# Patient Record
Sex: Female | Born: 1995 | Race: Black or African American | Hispanic: No | Marital: Single | Smoking: Never smoker
Health system: Southern US, Community
[De-identification: ages and names within clinical notes are randomized; demographics above are authoritative.]

## PROBLEM LIST (undated history)

## (undated) DIAGNOSIS — R7303 Prediabetes: Secondary | ICD-10-CM

## (undated) HISTORY — DX: Prediabetes: R73.03

---
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
## 2017-04-16 ENCOUNTER — Ambulatory Visit: Payer: Medicaid Other | Attending: Family Medicine | Admitting: Family Medicine

## 2017-04-16 ENCOUNTER — Encounter: Payer: Self-pay | Admitting: Family Medicine

## 2017-04-16 VITALS — BP 132/82 | HR 117 | Temp 98.4°F | Wt 352.0 lb

## 2017-04-16 DIAGNOSIS — Z0189 Encounter for other specified special examinations: Secondary | ICD-10-CM | POA: Diagnosis present

## 2017-04-16 DIAGNOSIS — Z7984 Long term (current) use of oral hypoglycemic drugs: Secondary | ICD-10-CM | POA: Diagnosis not present

## 2017-04-16 DIAGNOSIS — R7303 Prediabetes: Secondary | ICD-10-CM

## 2017-04-16 NOTE — Patient Instructions (Signed)

## 2017-04-16 NOTE — Progress Notes (Signed)
Subjective:  Patient ID: Amy Shea, female    DOB: [birth year]  Age: 22 y.o. MRN: [id number]  CC: Establish Care and Weight Loss   HPI Amy Shea is a 22 year old obese female with a history of Pre Diabetes who presents to establish care with me. "I don't know why I'm here, my mom made the appointment". She is interested in weight loss and has attended the seminars required at the Bariatric clinic in anticipation of lapband procedure but is yet to meet with the surgeon. She would like to see what other options are available besides surgery.  She weighs 352 lbs and does not has a family history of obesity as he siblings are size 4 and 6. States she has always been a plus size since she was 14. She goes to the gym 3-4 times a week and eats salads most of the time cutting out crabs and sweets with no much results. The most she has lost is 30lbs which she gained back.  Past Medical History:  Diagnosis Date  . Pre-diabetes 06/04/2013    No past surgical history on file.  No Known Allergies    Outpatient Medications Prior to Visit  Medication Sig Dispense Refill  . acetaminophen (TYLENOL) 325 MG tablet Take 650 mg by mouth every 6 (six) hours as needed for pain (headache).    . ferrous sulfate 325 (65 FE) MG tablet Take 1 tablet (325 mg total) by mouth 2 (two) times daily with a meal. (Patient not taking: Reported on 11/21/2015) 60 tablet 3  . metFORMIN (GLUCOPHAGE) 500 MG tablet TAKE 1 TABLET BY MOUTH TWICE DAILY WITH A MEAL (Patient not taking: Reported on 11/21/2015) 60 tablet 0  . ondansetron (ZOFRAN ODT) 4 MG disintegrating tablet Take 1 tablet (4 mg total) by mouth every 8 (eight) hours as needed for nausea. (Patient not taking: Reported on 11/21/2015) 20 tablet 1  . ranitidine (ZANTAC) 150 MG tablet Take 1 tablet (150 mg total) by mouth 2 (two) times daily. (Patient not taking: Reported on 11/21/2015) 60 tablet 6  . VESTURA 3-0.02 MG tablet TAKE 1 TABLET BY MOUTH  ONCE DAILY (Patient not taking: Reported on 11/21/2015) 28 tablet 0   No facility-administered medications prior to visit.     ROS Review of Systems  Constitutional: Negative for activity change, appetite change and fatigue.  HENT: Negative for congestion, sinus pressure and sore throat.   Eyes: Negative for visual disturbance.  Respiratory: Negative for cough, chest tightness, shortness of breath and wheezing.   Cardiovascular: Negative for chest pain and palpitations.  Gastrointestinal: Negative for abdominal distention, abdominal pain and constipation.  Endocrine: Negative for polydipsia.  Genitourinary: Negative for dysuria and frequency.  Musculoskeletal: Negative for arthralgias and back pain.  Skin: Negative for rash.  Neurological: Negative for tremors, light-headedness and numbness.  Hematological: Does not bruise/bleed easily.  Psychiatric/Behavioral: Negative for agitation and behavioral problems.    Objective:  BP 132/82   Pulse (!) 117   Temp 98.4 F (36.9 C) (Oral)   Wt (!) 352 lb (159.7 kg)   SpO2 100%   BMI 55.96 kg/m   BP/Weight 04/16/2017 7/82/9562 03/07/863  Systolic BP 784 696 295  Diastolic BP 82 86 76  Wt. (Lbs) 352 323.4 281  BMI 55.96 51.42 44.36      Physical Exam  Constitutional: She is oriented to person, place, and time. She appears well-developed and well-nourished.  Cardiovascular: Normal heart sounds and intact distal pulses. Tachycardia present.  No murmur heard. Pulmonary/Chest: Effort normal and breath sounds normal. She has no wheezes. She has no rales. She exhibits no tenderness.  Abdominal: Soft. Bowel sounds are normal. She exhibits no distension and no mass. There is no tenderness.  Musculoskeletal: Normal range of motion.  Neurological: She is alert and oriented to person, place, and time.     Assessment & Plan:   1. Pre-diabetes Last A1c was 5.5 If A1c returns in the pre diabetic range I will commence Saxenda or Victoza  depending on insurance coverage to also help with weight loss. Advised on continuing with a diabetic diet to prevent development of Diabetes mellitus - CMP14+EGFR - Hemoglobin A1c  2. Morbid obesity ([place]) Commended on current efforts Discussed daily caloric goal of 1200cal, weight loss of 1-2 lbs/week recommended which is about 3500-7000 calorie cut per week Exercise 150 min/week Discussed long term medications for management of obesity, side effect profile and insurance coverage Surgery will produce the best result and she has been advised that lifestyle modifications will produce the greatest long term benefit as that is an adjunct to medications and surgery. - CBC with Differential/Platelet - TSH   No orders of the defined types were placed in this encounter.   Follow-up: Return in about 1 month (around 05/14/2017) for follow up on weight problems.   Charlott Rakes MD

## 2017-04-17 ENCOUNTER — Other Ambulatory Visit: Payer: Self-pay | Admitting: Family Medicine

## 2017-04-17 ENCOUNTER — Telehealth: Payer: Self-pay

## 2017-04-17 ENCOUNTER — Encounter: Payer: Self-pay | Admitting: Family Medicine

## 2017-04-17 LAB — CMP14+EGFR
ALBUMIN: 4.1 g/dL (ref 3.5–5.5)
ALT: 46 IU/L — ABNORMAL HIGH (ref 0–32)
AST: 21 IU/L (ref 0–40)
Albumin/Globulin Ratio: 1.3 (ref 1.2–2.2)
Alkaline Phosphatase: 85 IU/L (ref 39–117)
BUN / CREAT RATIO: 10 (ref 9–23)
BUN: 7 mg/dL (ref 6–20)
CO2: 24 mmol/L (ref 20–29)
CREATININE: 0.69 mg/dL (ref 0.57–1.00)
Calcium: 9.5 mg/dL (ref 8.7–10.2)
Chloride: 104 mmol/L (ref 96–106)
GFR calc non Af Amer: 126 mL/min/{1.73_m2} (ref >59)
GFR, EST AFRICAN AMERICAN: 145 mL/min/{1.73_m2} (ref >59)
GLUCOSE: 84 mg/dL (ref 65–99)
Globulin, Total: 3.2 g/dL (ref 1.5–4.5)
Potassium: 4.1 mmol/L (ref 3.5–5.2)
Sodium: 141 mmol/L (ref 134–144)
TOTAL PROTEIN: 7.3 g/dL (ref 6.0–8.5)

## 2017-04-17 LAB — CBC WITH DIFFERENTIAL/PLATELET
Basophils Absolute: 0.1 10*3/uL (ref 0.0–0.2)
Basos: 1 %
EOS (ABSOLUTE): 0.3 10*3/uL (ref 0.0–0.4)
Eos: 4 %
HEMOGLOBIN: 10.9 g/dL — AB (ref 11.1–15.9)
Hematocrit: 34 % (ref 34.0–46.6)
Immature Grans (Abs): 0 10*3/uL (ref 0.0–0.1)
Immature Granulocytes: 0 %
LYMPHS ABS: 1.7 10*3/uL (ref 0.7–3.1)
Lymphs: 24 %
MCH: 22.6 pg — ABNORMAL LOW (ref 26.6–33.0)
MCHC: 32.1 g/dL (ref 31.5–35.7)
MCV: 70 fL — AB (ref 79–97)
MONOCYTES: 13 %
Monocytes Absolute: 0.9 10*3/uL (ref 0.1–0.9)
NEUTROS ABS: 4.2 10*3/uL (ref 1.4–7.0)
Neutrophils: 58 %
Platelets: 458 10*3/uL — ABNORMAL HIGH (ref 150–379)
RBC: 4.83 x10E6/uL (ref 3.77–5.28)
RDW: 18 % — ABNORMAL HIGH (ref 12.3–15.4)
WBC: 7.1 10*3/uL (ref 3.4–10.8)

## 2017-04-17 LAB — HEMOGLOBIN A1C
Est. average glucose Bld gHb Est-mCnc: 131 mg/dL
HEMOGLOBIN A1C: 6.2 % — AB (ref 4.8–5.6)

## 2017-04-17 LAB — TSH: TSH: 2.18 u[IU]/mL (ref 0.450–4.500)

## 2017-04-17 MED ORDER — INSULIN PEN NEEDLE 31G X 5 MM MISC: 1.0000 | Freq: Every day | 5 refills | Status: AC

## 2017-04-17 MED ORDER — FERROUS SULFATE 325 (65 FE) MG PO TABS: 325.0000 mg | ORAL_TABLET | Freq: Two times a day (BID) | ORAL | 3 refills | Status: DC

## 2017-04-17 MED ORDER — LIRAGLUTIDE -WEIGHT MANAGEMENT 18 MG/3ML ~~LOC~~ SOPN: 0.6000 mg | PEN_INJECTOR | Freq: Every day | SUBCUTANEOUS | 3 refills | Status: DC

## 2017-04-17 NOTE — Telephone Encounter (Signed)
Patient was called and informed of lab results and medication being sent to pharmacy. 

## 2017-04-22 ENCOUNTER — Telehealth: Payer: Self-pay | Admitting: Family Medicine

## 2017-04-22 NOTE — Telephone Encounter (Signed)
Patient called and requested for an alternate of listed medication due to insurance not covering it. Please fu at your earliest convenience.  Liraglutide -Weight Management (SAXENDA) 18 MG/3ML SOPN [[id number]

## 2017-04-24 ENCOUNTER — Encounter: Payer: Self-pay | Admitting: Pharmacist

## 2017-04-24 MED ORDER — LIRAGLUTIDE 18 MG/3ML ~~LOC~~ SOPN: 0.6000 mg | PEN_INJECTOR | Freq: Every day | SUBCUTANEOUS | 3 refills | Status: AC

## 2017-04-24 NOTE — Progress Notes (Signed)
PA submitted for Victoza. Pending approval by [place] Medicaid 

## 2017-04-24 NOTE — Telephone Encounter (Signed)
A new prescription for Victoza has been sent to her pharmacy.

## 2017-04-25 NOTE — Progress Notes (Signed)
Victoza approved.

## 2017-05-09 ENCOUNTER — Other Ambulatory Visit: Payer: Self-pay | Admitting: Surgical Oncology

## 2017-05-09 DIAGNOSIS — K449 Diaphragmatic hernia without obstruction or gangrene: Secondary | ICD-10-CM

## 2017-05-14 ENCOUNTER — Ambulatory Visit: Payer: Medicaid Other | Admitting: Family Medicine

## 2017-05-15 ENCOUNTER — Ambulatory Visit
Admission: RE | Admit: 2017-05-15 | Discharge: 2017-05-15 | Disposition: A | Discharge status: Home / Self Care | Payer: Medicaid Other | Source: Ambulatory Visit | Attending: Surgical Oncology | Admitting: Surgical Oncology

## 2017-05-15 DIAGNOSIS — K449 Diaphragmatic hernia without obstruction or gangrene: Secondary | ICD-10-CM

## 2017-10-29 ENCOUNTER — Ambulatory Visit (INDEPENDENT_AMBULATORY_CARE_PROVIDER_SITE_OTHER): Payer: Medicaid Other | Admitting: Psychiatry

## 2017-10-29 DIAGNOSIS — F4320 Adjustment disorder, unspecified: Secondary | ICD-10-CM

## 2018-06-04 ENCOUNTER — Ambulatory Visit (HOSPITAL_COMMUNITY)
Admission: EM | Admit: 2018-06-04 | Discharge: 2018-06-04 | Disposition: A | Discharge status: Home / Self Care | Payer: Self-pay | Attending: Family Medicine | Admitting: Family Medicine

## 2018-06-04 ENCOUNTER — Other Ambulatory Visit: Payer: Self-pay

## 2018-06-04 ENCOUNTER — Encounter (HOSPITAL_COMMUNITY): Payer: Self-pay

## 2018-06-04 DIAGNOSIS — R109 Unspecified abdominal pain: Secondary | ICD-10-CM

## 2018-06-04 NOTE — ED Triage Notes (Signed)
Pt states she has right flank pain, its worst when she eats. X 3 days.

## 2018-06-04 NOTE — Discharge Instructions (Signed)
You have been seen today for right flank pain. Your evaluation was not suggestive of any emergent condition requiring medical intervention at this time. However, some abdominal problems make take more time to appear. Therefore, it's very important for you to pay attention to any new symptoms or worsening of your current condition.  Please return here or to the Emergency Department immediately should you feel worse in any way or have any of the following symptoms: increasing or different abdominal pain, persistent vomiting, fevers, or shaking chills.

## 2018-06-04 NOTE — ED Notes (Signed)
Patient verbalizes understanding of discharge instructions. Opportunity for questioning and answers were provided. Patient discharged from [facility] by MD. 

## 2018-06-12 ENCOUNTER — Other Ambulatory Visit: Payer: Self-pay | Admitting: Nurse Practitioner

## 2018-06-12 DIAGNOSIS — R1011 Right upper quadrant pain: Secondary | ICD-10-CM

## 2018-06-17 ENCOUNTER — Other Ambulatory Visit: Payer: Self-pay

## 2018-06-17 NOTE — ED Provider Notes (Signed)
[facility]   [id number] 06/04/18 Arrival Time: 1109  ASSESSMENT & PLAN:  1. Right flank pain    Benign abdominal exam. No indications for urgent abdominal/pelvic imaging at this time. Discussed. She is unable to provide urine sample. Unclear of exact etiology at this time. Discussed possibility of constipation causing her symptoms. Cannot r/o nephrolithiasis. Will watch her symptoms closely.   Discharge Instructions     You have been seen today for right flank pain. Your evaluation was not suggestive of any emergent condition requiring medical intervention at this time. However, some abdominal problems make take more time to appear. Therefore, it's very important for you to pay attention to any new symptoms or worsening of your current condition.  Please return here or to the Emergency Department immediately should you feel worse in any way or have any of the following symptoms: increasing or different abdominal pain, persistent vomiting, fevers, or shaking chills.     Follow-up Information    [facility]   Specialty:  Urgent Care Contact information: [address] [name] [phone]         Reviewed expectations re: course of current medical issues. Questions answered. Outlined signs and symptoms indicating need for more acute intervention. Patient verbalized understanding. After Visit Summary given.   SUBJECTIVE: History from: patient. Amy Shea is a 23 y.o. female who presents with complaint of intermittent R flank discomfort. Onset abrupt, about 3 days ago. Discomfort described as sharp; without radiation. Symptoms are gradually improving since beginning. Fever: absent. Aggravating factors: does report noticing pain sometimes after she eats but not always. Alleviating factors: have not been identified. Associated symptoms: none reported. She denies arthralgias, belching, chills,  constipation, diarrhea, dysuria, headache, hematuria, myalgias, nausea, sweats and vomiting. Appetite: normal. PO intake: normal. Ambulatory without assistance. Urinary symptoms: none. Bowel movements: questions decrease in bowel movement frequency; last bowel movement within the past 3 days and without blood. Is passing gas from rectum. History of similar: no. OTC treatment: none reported.  Patient's last menstrual period was 05/19/2018.   History reviewed. No pertinent surgical history.  ROS: As per HPI. All other systems negative.  OBJECTIVE:  Vitals:   06/04/18 1133 06/04/18 1134  BP: (!) 147/105   Pulse: 99   Resp: 18   Temp: 98 F (36.7 C)   TempSrc: Oral   SpO2: 99%   Weight:  90.7 kg    General appearance: alert, oriented, no acute distress  HEENT: oropharynx moist Lungs: clear to auscultation bilaterally; unlabored respirations Heart: regular rate and rhythm Abdomen: soft; without distention; no tenderness; normal bowel sounds; without masses or organomegaly; without guarding or rebound tenderness Back: without CVA tenderness; FROM at waist Extremities: without LE edema; symmetrical; without gross deformities Skin: warm and dry Neurologic: normal gait Psychological: alert and cooperative; normal mood and affect  No Known Allergies                                             Past Medical History:  Diagnosis Date  . Pre-diabetes 06/04/2013   Social History   Socioeconomic History  . Marital status: Single    Spouse name: Not on file  . Number of children: Not on file  . Years of education: Not on file  . Highest education level: Not on file  Occupational History  .  Not on file  Social Needs  . Financial resource strain: Not on file  . Food insecurity:    Worry: Not on file    Inability: Not on file  . Transportation needs:    Medical: Not on file    Non-medical: Not on file  Tobacco Use  . Smoking status: Never Smoker  . Smokeless tobacco: Never Used   Substance and Sexual Activity  . Alcohol use: No  . Drug use: No  . Sexual activity: Not on file  Lifestyle  . Physical activity:    Days per week: Not on file    Minutes per session: Not on file  . Stress: Not on file  Relationships  . Social connections:    Talks on phone: Not on file    Gets together: Not on file    Attends religious service: Not on file    Active member of club or organization: Not on file    Attends meetings of clubs or organizations: Not on file    Relationship status: Not on file  . Intimate partner violence:    Fear of current or ex partner: Not on file    Emotionally abused: Not on file    Physically abused: Not on file    Forced sexual activity: Not on file  Other Topics Concern  . Not on file  Social History Narrative   [facility] [employer]    Lives with mom and 2 brothers   Family History  Problem Relation Age of Onset  . Diabetes Mother   . Hypertension Mother      Mardella LaymanHagler, Rees Santistevan, MD 06/17/18 [phone]

## 2018-06-25 ENCOUNTER — Emergency Department (HOSPITAL_COMMUNITY): Payer: Self-pay

## 2018-06-25 ENCOUNTER — Emergency Department (HOSPITAL_COMMUNITY)
Admission: EM | Admit: 2018-06-25 | Discharge: 2018-06-25 | Disposition: A | Discharge status: Home / Self Care | Payer: Self-pay | Attending: Emergency Medicine | Admitting: Emergency Medicine

## 2018-06-25 ENCOUNTER — Other Ambulatory Visit: Payer: Self-pay

## 2018-06-25 ENCOUNTER — Encounter (HOSPITAL_COMMUNITY): Payer: Self-pay

## 2018-06-25 DIAGNOSIS — I10 Essential (primary) hypertension: Secondary | ICD-10-CM | POA: Insufficient documentation

## 2018-06-25 DIAGNOSIS — Z79899 Other long term (current) drug therapy: Secondary | ICD-10-CM | POA: Insufficient documentation

## 2018-06-25 DIAGNOSIS — D649 Anemia, unspecified: Secondary | ICD-10-CM | POA: Insufficient documentation

## 2018-06-25 DIAGNOSIS — N200 Calculus of kidney: Secondary | ICD-10-CM | POA: Insufficient documentation

## 2018-06-25 DIAGNOSIS — N201 Calculus of ureter: Secondary | ICD-10-CM | POA: Insufficient documentation

## 2018-06-25 DIAGNOSIS — R109 Unspecified abdominal pain: Secondary | ICD-10-CM

## 2018-06-25 LAB — CBC
HCT: 29 % — ABNORMAL LOW (ref 36.0–46.0)
Hemoglobin: 8.6 g/dL — ABNORMAL LOW (ref 12.0–15.0)
MCH: 21.8 pg — ABNORMAL LOW (ref 26.0–34.0)
MCHC: 29.7 g/dL — ABNORMAL LOW (ref 30.0–36.0)
MCV: 73.6 fL — ABNORMAL LOW (ref 80.0–100.0)
Platelets: 488 10*3/uL — ABNORMAL HIGH (ref 150–400)
RBC: 3.94 MIL/uL (ref 3.87–5.11)
RDW: 18.6 % — ABNORMAL HIGH (ref 11.5–15.5)
WBC: 8.8 10*3/uL (ref 4.0–10.5)
nRBC: 0 % (ref 0.0–0.2)

## 2018-06-25 LAB — BASIC METABOLIC PANEL
Anion gap: 8 (ref 5–15)
BUN: 11 mg/dL (ref 6–20)
CO2: 24 mmol/L (ref 22–32)
Calcium: 8.8 mg/dL — ABNORMAL LOW (ref 8.9–10.3)
Chloride: 106 mmol/L (ref 98–111)
Creatinine, Ser: 0.89 mg/dL (ref 0.44–1.00)
GFR calc Af Amer: >60 mL/min (ref >60)
GFR calc non Af Amer: >60 mL/min (ref >60)
Glucose, Bld: 101 mg/dL — ABNORMAL HIGH (ref 70–99)
Potassium: 4 mmol/L (ref 3.5–5.1)
Sodium: 138 mmol/L (ref 135–145)

## 2018-06-25 LAB — FOLATE: Folate: 16.6 ng/mL (ref >5.9)

## 2018-06-25 LAB — URINALYSIS, ROUTINE W REFLEX MICROSCOPIC
Bilirubin Urine: NEGATIVE
Glucose, UA: NEGATIVE mg/dL
Ketones, ur: NEGATIVE mg/dL
Nitrite: NEGATIVE
Protein, ur: NEGATIVE mg/dL
RBC / HPF: >50 RBC/hpf — ABNORMAL HIGH (ref 0–5)
Specific Gravity, Urine: 1.012 (ref 1.005–1.030)
pH: 7 (ref 5.0–8.0)

## 2018-06-25 LAB — I-STAT BETA HCG BLOOD, ED (MC, WL, AP ONLY): I-stat hCG, quantitative: <5 m[IU]/mL (ref <5)

## 2018-06-25 LAB — RETICULOCYTES
Immature Retic Fract: 10.8 % (ref 2.3–15.9)
RBC.: 4.23 MIL/uL (ref 3.87–5.11)
Retic Count, Absolute: 34.3 10*3/uL (ref 19.0–186.0)
Retic Ct Pct: 0.8 % (ref 0.4–3.1)

## 2018-06-25 LAB — VITAMIN B12: Vitamin B-12: 511 pg/mL (ref 180–914)

## 2018-06-25 LAB — IRON AND TIBC
Iron: 13 ug/dL — ABNORMAL LOW (ref 28–170)
Saturation Ratios: 3 % — ABNORMAL LOW (ref 10.4–31.8)
TIBC: 448 ug/dL (ref 250–450)
UIBC: 435 ug/dL

## 2018-06-25 LAB — FERRITIN: Ferritin: 7 ng/mL — ABNORMAL LOW (ref 11–307)

## 2018-06-25 MED ORDER — TAMSULOSIN HCL 0.4 MG PO CAPS: 0.4000 mg | ORAL_CAPSULE | Freq: Every day | ORAL | 0 refills | Status: DC

## 2018-06-25 MED ORDER — HYDROCODONE-ACETAMINOPHEN 5-325 MG PO TABS
1.0000 | ORAL_TABLET | Freq: Once | ORAL | Status: AC
  Administered 2018-06-25: 23:00:00 1 via ORAL
  Filled 2018-06-25: qty 1

## 2018-06-25 MED ORDER — ONDANSETRON 4 MG PO TBDP: 4.0000 mg | ORAL_TABLET | Freq: Four times a day (QID) | ORAL | 0 refills | Status: AC | PRN

## 2018-06-25 MED ORDER — HYDROCODONE-ACETAMINOPHEN 5-325 MG PO TABS: 1.0000 | ORAL_TABLET | Freq: Four times a day (QID) | ORAL | 0 refills | Status: AC | PRN

## 2018-06-25 NOTE — Discharge Instructions (Addendum)
Please take Tylenol (acetaminophen) to relieve your pain.  You may take tylenol, up to 1,000 mg (two extra strength pills).  Do not take more than 3,000 mg tylenol in a 24 hour period.  Please check all medication labels as many medications such as pain and cold medications may contain tylenol. Please do not drink alcohol while taking this medication. Your pain medication has tylenol in it.   Today you received medications that may make you sleepy or impair your ability to make decisions.  For the next 24 hours please do not drive, operate heavy machinery, care for a small child with out another adult present, or perform any activities that may cause harm to you or someone else if you were to fall asleep or be impaired.   You are being prescribed a medication which may make you sleepy. Please follow up of listed precautions for at least 24 hours after taking one dose.  As we discussed today your labs show that you are anemic.  Please follow-up with your primary care doctor about this.  We also discussed that I ordered an anemia panel so that this will be available for your primary care doctor to review.

## 2018-06-25 NOTE — ED Notes (Signed)
Patient transported to CT 

## 2018-06-25 NOTE — ED Triage Notes (Signed)
Pt reports having flank pain this morning on the right side. Pain 8/10.

## 2018-06-25 NOTE — ED Notes (Signed)
All appropriate discharge materials reviewed at length with patient. Time for questions provided. Pt has no other questions at this time and verbalizes understanding of all provided materials.  

## 2018-06-25 NOTE — ED Provider Notes (Signed)
[facility] Provider Note   CSN: [id number] Arrival date & time: 06/25/18  1947    History   Chief Complaint Chief Complaint  Patient presents with  . Flank Pain    HPI Amy Shea is a 23 y.o. female with past medical history of hypertension, morbid obesity status post bariatric surgery, anemia, who presents today for evaluation of right flank pain.  She reports that for approximately 20 days she has been having intermittent right-sided flank pain.  She has been seen for this at 2 different urgent care centers, one time she was treated with Flomax which provided relief.  She reports urinary frequency and foul odor however denies urgency.  She reports that the pain waxes and wanes.  This episode of pain started this morning.  She denies any vaginal discharge, states that she is not sexually active.     HPI  Past Medical History:  Diagnosis Date  . Pre-diabetes 06/04/2013    Patient Active Problem List   Diagnosis Date Noted  . Pre-diabetes 06/04/2013  . Morbid obesity ([facility]) 06/04/2013  . Acquired acanthosis nigricans 06/04/2013  . Essential hypertension, benign 06/04/2013  . Insulin resistance 06/04/2013  . Hyperinsulinemia 06/04/2013  . Dyspepsia 06/04/2013  . Goiter 06/04/2013  . Anemia 06/04/2013    History reviewed. No pertinent surgical history.   OB History   No obstetric history on file.      Home Medications    Prior to Admission medications   Medication Sig Start Date End Date Taking? Authorizing Provider  Multiple Vitamin (MULTIVITAMIN WITH MINERALS) TABS tablet Take 1 tablet by mouth daily.   Yes [provider]  ursodiol (ACTIGALL) 250 MG tablet Take 250 mg by mouth 2 (two) times a day. 01/07/18  Yes [provider]  HYDROcodone-acetaminophen (NORCO/VICODIN) 5-325 MG tablet Take 1 tablet by mouth every 6 (six) hours as needed for severe pain. 06/25/18   Cristina GongHammond, Vernie Vinciguerra W, PA-C  Insulin  Pen Needle 31G X 5 MM MISC 1 each by Does not apply route daily. 04/17/17   Hoy RegisterNewlin, Enobong, MD  liraglutide (VICTOZA) 18 MG/3ML SOPN Inject 0.1 mLs (0.6 mg total) into the skin daily with breakfast. Patient not taking: Reported on 06/25/2018 04/24/17   Hoy RegisterNewlin, Enobong, MD  ondansetron (ZOFRAN ODT) 4 MG disintegrating tablet Take 1 tablet (4 mg total) by mouth every 6 (six) hours as needed for nausea or vomiting. 06/25/18   Cristina GongHammond, Nicholette Dolson W, PA-C  tamsulosin (FLOMAX) 0.4 MG CAPS capsule Take 1 capsule (0.4 mg total) by mouth daily for 30 days. 06/25/18 07/25/18  Cristina GongHammond, Marcellina Jonsson W, PA-C    Family History Family History  Problem Relation Age of Onset  . Diabetes Mother   . Hypertension Mother     Social History Social History   Tobacco Use  . Smoking status: Never Smoker  . Smokeless tobacco: Never Used  Substance Use Topics  . Alcohol use: No  . Drug use: No     Allergies   Nsaids   Review of Systems Review of Systems  Constitutional: Negative for chills and fever.  HENT: Negative for congestion.   Eyes: Negative for visual disturbance.  Respiratory: Negative for chest tightness and shortness of breath.   Gastrointestinal: Negative for abdominal pain, diarrhea, nausea and vomiting.  Genitourinary: Positive for flank pain and frequency. Negative for difficulty urinating, dysuria, menstrual problem, vaginal bleeding and vaginal discharge.  Musculoskeletal: Negative for neck pain.  Skin: Negative for color change.  Neurological: Negative  for dizziness, weakness and numbness.  All other systems reviewed and are negative.    Physical Exam Updated Vital Signs BP 114/76   Pulse 71   Temp 98.2 F (36.8 C) (Oral)   Resp 16   SpO2 100%   Physical Exam Vitals signs and nursing note reviewed.  Constitutional:      General: She is not in acute distress.    Appearance: She is well-developed.  HENT:     Head: Normocephalic and atraumatic.  Eyes:     Conjunctiva/sclera:  Conjunctivae normal.  Neck:     Musculoskeletal: Neck supple.  Cardiovascular:     Rate and Rhythm: Normal rate and regular rhythm.     Heart sounds: Normal heart sounds. No murmur.  Pulmonary:     Effort: Pulmonary effort is normal. No respiratory distress.     Breath sounds: Normal breath sounds.  Abdominal:     Palpations: Abdomen is soft.     Tenderness: There is no abdominal tenderness. There is no right CVA tenderness or left CVA tenderness.     Comments: Exam limited by body habitus.   Skin:    General: Skin is warm and dry.  Neurological:     General: No focal deficit present.     Mental Status: She is alert.  Psychiatric:        Mood and Affect: Mood normal.        Behavior: Behavior normal.      ED Treatments / Results  Labs (all labs ordered are listed, but only abnormal results are displayed) Labs Reviewed  BASIC METABOLIC PANEL - Abnormal; Notable for the following components:      Result Value   Glucose, Bld 101 (*)    Calcium 8.8 (*)    All other components within normal limits  CBC - Abnormal; Notable for the following components:   Hemoglobin 8.6 (*)    HCT 29.0 (*)    MCV 73.6 (*)    MCH 21.8 (*)    MCHC 29.7 (*)    RDW 18.6 (*)    Platelets 488 (*)    All other components within normal limits  URINALYSIS, ROUTINE W REFLEX MICROSCOPIC - Abnormal; Notable for the following components:   APPearance HAZY (*)    Hgb urine dipstick LARGE (*)    Leukocytes,Ua TRACE (*)    RBC / HPF >50 (*)    Bacteria, UA FEW (*)    All other components within normal limits  IRON AND TIBC - Abnormal; Notable for the following components:   Iron 13 (*)    Saturation Ratios 3 (*)    All other components within normal limits  FERRITIN - Abnormal; Notable for the following components:   Ferritin 7 (*)    All other components within normal limits  URINE CULTURE  VITAMIN B12  FOLATE  RETICULOCYTES  I-STAT BETA HCG BLOOD, ED ([facility], [facility], [facility] ONLY)    EKG None   Radiology Ct Renal Stone Study  Result Date: 06/25/2018 CLINICAL DATA:  Right flank pain EXAM: CT ABDOMEN AND PELVIS WITHOUT CONTRAST TECHNIQUE: Multidetector CT imaging of the abdomen and pelvis was performed following the standard protocol without IV contrast. COMPARISON:  None. FINDINGS: Lower chest: Lung bases are clear. No effusions. Heart is normal size. Hepatobiliary: Diffuse low-density throughout the liver compatible with fatty infiltration. No focal abnormality. Gallbladder unremarkable. Pancreas: No focal abnormality or ductal dilatation. Spleen: No focal abnormality.  Normal size. Adrenals/Urinary Tract: 5 mm nonobstructing upper pole stone  on the right. There is mild fullness of the right renal collecting system. The right ureter is difficult to follow. There is a 6 mm calcification in the right lower pelvis concerning for distal right ureteral stone. No stones or hydronephrosis on the left. Adrenal glands and urinary bladder unremarkable. Stomach/Bowel: Normal appendix. Stomach, large and small bowel grossly unremarkable. 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 Vascular/Lymphatic: No evidence of aneurysm or adenopathy. Reproductive: Uterus and adnexa unremarkable.  No mass. Other: No free fluid or free air. Musculoskeletal: No acute bony abnormality. IMPRESSION: Right upper pole nephrolithiasis. 6 mm calcification in the right lower pelvis concerning for distal right ureteral stone. Mild fullness of the right renal collecting system without overt hydronephrosis. Fatty infiltration of the liver. Electronically Signed   By: Charlett Nose M.D.   On: 06/25/2018 21:42    Procedures Procedures (including critical care time)  Medications Ordered in ED Medications  HYDROcodone-acetaminophen (NORCO/VICODIN) 5-325 MG per tablet 1 tablet (1 tablet Oral Given 06/25/18 2257)     Initial Impression / Assessment and Plan / ED Course  I have reviewed the triage  vital signs and the nursing notes.  Pertinent labs & imaging results that were available during my care of the patient were reviewed by me and considered in my medical decision making (see chart for details).       Patient presents today for evaluation of right-sided flank pain.  She has intermittently had this pain for approximately 2 weeks, however today became very severe in nature.  She has not had any imaging previously, however based on the colicky nature of her pain I am highly suspicious for renal stone.  Labs were obtained and reviewed, she is anemic, previous labs from 4 months ago show she has dropped her hemoglobin by approximately 2 g.  Anemia panel was sent.  She is not symptomatic with this, and I suspect that this is not an acute process, recommend outpatient follow-up.  Her pregnancy test was negative.  UA shows over 50 RBCs with 6-10 whites, few bacteria, and 0-5 squamous epithelial cells.  Urine culture is sent, as she has a stone, however do not feel that she has evidence of a acute UTI requiring antibiotics.  Her creatinine is not elevated, GFR is normal.  CT renal stone study was ordered showing a right-sided 6 mm ureteral stone without obvious hydronephrosis.  I suspect that this is the cause for her pain and symptoms.  Her pain was treated in the emergency room with Vicodin.  She is given prescriptions for Vicodin at home, Zofran, and Flomax.  We discussed urology follow-up as needed, and that if she needs additional emergency medical care she should go to [facility] long emergency room.    Return precautions were discussed with patient who states their understanding.  At the time of discharge patient denied any unaddressed complaints or concerns.  Patient is agreeable for discharge home.    Final Clinical Impressions(s) / ED Diagnoses   Final diagnoses:  Nephrolithiasis  Right flank pain  Ureterolithiasis  Anemia, unspecified type    ED Discharge Orders         Ordered     tamsulosin (FLOMAX) 0.4 MG CAPS capsule  Daily     06/25/18 2249    HYDROcodone-acetaminophen (NORCO/VICODIN) 5-325 MG tablet  Every 6 hours PRN     06/25/18 2249    ondansetron (ZOFRAN ODT) 4 MG disintegrating tablet  Every 6 hours PRN  06/25/18 2249           Cristina Gong, PA-C 06/25/18 2328    Raeford Razor, MD 06/30/18 [phone]

## 2018-06-26 LAB — URINE CULTURE

## 2018-07-02 ENCOUNTER — Other Ambulatory Visit: Payer: Self-pay

## 2018-07-22 ENCOUNTER — Encounter (HOSPITAL_COMMUNITY): Payer: Self-pay | Admitting: Emergency Medicine

## 2018-07-22 ENCOUNTER — Other Ambulatory Visit: Payer: Self-pay

## 2018-07-22 ENCOUNTER — Emergency Department (HOSPITAL_COMMUNITY)
Admission: EM | Admit: 2018-07-22 | Discharge: 2018-07-22 | Disposition: A | Discharge status: Home / Self Care | Payer: Self-pay | Attending: Emergency Medicine | Admitting: Emergency Medicine

## 2018-07-22 ENCOUNTER — Emergency Department (HOSPITAL_COMMUNITY): Payer: Self-pay

## 2018-07-22 DIAGNOSIS — D649 Anemia, unspecified: Secondary | ICD-10-CM | POA: Insufficient documentation

## 2018-07-22 DIAGNOSIS — Z87442 Personal history of urinary calculi: Secondary | ICD-10-CM | POA: Insufficient documentation

## 2018-07-22 DIAGNOSIS — Z6841 Body Mass Index (BMI) 40.0 and over, adult: Secondary | ICD-10-CM | POA: Insufficient documentation

## 2018-07-22 DIAGNOSIS — R350 Frequency of micturition: Secondary | ICD-10-CM | POA: Insufficient documentation

## 2018-07-22 DIAGNOSIS — Z9884 Bariatric surgery status: Secondary | ICD-10-CM | POA: Insufficient documentation

## 2018-07-22 DIAGNOSIS — N202 Calculus of kidney with calculus of ureter: Secondary | ICD-10-CM | POA: Insufficient documentation

## 2018-07-22 DIAGNOSIS — I10 Essential (primary) hypertension: Secondary | ICD-10-CM | POA: Insufficient documentation

## 2018-07-22 DIAGNOSIS — N200 Calculus of kidney: Secondary | ICD-10-CM

## 2018-07-22 DIAGNOSIS — E669 Obesity, unspecified: Secondary | ICD-10-CM | POA: Insufficient documentation

## 2018-07-22 LAB — CBC WITH DIFFERENTIAL/PLATELET
Abs Immature Granulocytes: 0.02 10*3/uL (ref 0.00–0.07)
Basophils Absolute: 0.1 10*3/uL (ref 0.0–0.1)
Basophils Relative: 1 %
Eosinophils Absolute: 0.1 10*3/uL (ref 0.0–0.5)
Eosinophils Relative: 1 %
HCT: 33.2 % — ABNORMAL LOW (ref 36.0–46.0)
Hemoglobin: 9.5 g/dL — ABNORMAL LOW (ref 12.0–15.0)
Immature Granulocytes: 0 %
Lymphocytes Relative: 21 %
Lymphs Abs: 1.6 10*3/uL (ref 0.7–4.0)
MCH: 21.4 pg — ABNORMAL LOW (ref 26.0–34.0)
MCHC: 28.6 g/dL — ABNORMAL LOW (ref 30.0–36.0)
MCV: 74.8 fL — ABNORMAL LOW (ref 80.0–100.0)
Monocytes Absolute: 0.6 10*3/uL (ref 0.1–1.0)
Monocytes Relative: 7 %
Neutro Abs: 5.4 10*3/uL (ref 1.7–7.7)
Neutrophils Relative %: 70 %
Platelets: 578 10*3/uL — ABNORMAL HIGH (ref 150–400)
RBC: 4.44 MIL/uL (ref 3.87–5.11)
RDW: 17.9 % — ABNORMAL HIGH (ref 11.5–15.5)
WBC: 7.8 10*3/uL (ref 4.0–10.5)
nRBC: 0 % (ref 0.0–0.2)

## 2018-07-22 LAB — COMPREHENSIVE METABOLIC PANEL
ALT: 29 U/L (ref 0–44)
AST: 20 U/L (ref 15–41)
Albumin: 3.9 g/dL (ref 3.5–5.0)
Alkaline Phosphatase: 64 U/L (ref 38–126)
Anion gap: 11 (ref 5–15)
BUN: 6 mg/dL (ref 6–20)
CO2: 19 mmol/L — ABNORMAL LOW (ref 22–32)
Calcium: 9 mg/dL (ref 8.9–10.3)
Chloride: 108 mmol/L (ref 98–111)
Creatinine, Ser: 0.79 mg/dL (ref 0.44–1.00)
GFR calc Af Amer: >60 mL/min (ref >60)
GFR calc non Af Amer: >60 mL/min (ref >60)
Glucose, Bld: 81 mg/dL (ref 70–99)
Potassium: 3.8 mmol/L (ref 3.5–5.1)
Sodium: 138 mmol/L (ref 135–145)
Total Bilirubin: 0.3 mg/dL (ref 0.3–1.2)
Total Protein: 7.7 g/dL (ref 6.5–8.1)

## 2018-07-22 LAB — URINALYSIS, ROUTINE W REFLEX MICROSCOPIC
Bacteria, UA: NONE SEEN
Bilirubin Urine: NEGATIVE
Glucose, UA: NEGATIVE mg/dL
Ketones, ur: NEGATIVE mg/dL
Nitrite: NEGATIVE
Protein, ur: 30 mg/dL — AB
RBC / HPF: >50 RBC/hpf — ABNORMAL HIGH (ref 0–5)
Specific Gravity, Urine: 1.017 (ref 1.005–1.030)
pH: 5 (ref 5.0–8.0)

## 2018-07-22 LAB — POC URINE PREG, ED: Preg Test, Ur: NEGATIVE

## 2018-07-22 LAB — LIPASE, BLOOD: Lipase: 34 U/L (ref 11–51)

## 2018-07-22 MED ORDER — TAMSULOSIN HCL 0.4 MG PO CAPS: 0.4000 mg | ORAL_CAPSULE | Freq: Every day | ORAL | 0 refills | Status: AC

## 2018-07-22 MED ORDER — CEPHALEXIN 500 MG PO CAPS: 500.0000 mg | ORAL_CAPSULE | Freq: Two times a day (BID) | ORAL | 0 refills | Status: AC

## 2018-07-22 MED ORDER — OXYCODONE-ACETAMINOPHEN 5-325 MG PO TABS
1.0000 | ORAL_TABLET | Freq: Once | ORAL | Status: AC
  Administered 2018-07-22: 16:00:00 1 via ORAL
  Filled 2018-07-22: qty 1

## 2018-07-22 MED ORDER — OXYCODONE-ACETAMINOPHEN 5-325 MG PO TABS: 1.0000 | ORAL_TABLET | Freq: Four times a day (QID) | ORAL | 0 refills | Status: AC | PRN

## 2018-07-22 NOTE — ED Triage Notes (Signed)
Pt. Stated, Amy Shea had rt side pain and urinary frequency and feel like I need to empty my bladder but not emptying.Ive had a kidney stone before so it might be that.

## 2018-07-22 NOTE — Discharge Instructions (Signed)
Please read attached information. If you experience any new or worsening signs or symptoms please return to the emergency room for evaluation. Please follow-up with your primary care provider or specialist as discussed. Please use medication prescribed only as directed and discontinue taking if you have any concerning signs or symptoms.   °

## 2018-07-22 NOTE — ED Provider Notes (Signed)
[facility] EMERGENCY DEPARTMENT Provider Note   CSN: [id number] Arrival date & time: 07/22/18  1103    History   Chief Complaint Chief Complaint  Patient presents with  . Flank Pain    right    HPI Amy Shea is a 23 y.o. female.     HPI   23 year old female presents today with complaints of right-sided flank abdominal pain and Neri frequency.  Patient notes that she feels like she needs to pee frequently.  She denies any change in color or odor.  She denies any vaginal discharge or bleeding.  She notes pain in her suprapubic and right flank.  She notes this feels slightly similar to previous kidney stone but not as severe. Severe pain yesterday none today.  She notes previously when she had a kidney stone she did not have urinary symptoms.  She denies any nausea vomiting or fever.  No other abdominal pain.  No medications prior to arrival.  She does have a history of gastric bypass.  Past Medical History:  Diagnosis Date  . Pre-diabetes 06/04/2013    Patient Active Problem List   Diagnosis Date Noted  . Pre-diabetes 06/04/2013  . Morbid obesity (HCC) 06/04/2013  . Acquired acanthosis nigricans 06/04/2013  . Essential hypertension, benign 06/04/2013  . Insulin resistance 06/04/2013  . Hyperinsulinemia 06/04/2013  . Dyspepsia 06/04/2013  . Goiter 06/04/2013  . Anemia 06/04/2013    History reviewed. No pertinent surgical history.   OB History   No obstetric history on file.      Home Medications    Prior to Admission medications   Medication Sig Start Date End Date Taking? Authorizing Provider  acetaminophen (TYLENOL) 500 MG tablet Take 1,000 mg by mouth every 6 (six) hours as needed for mild pain.   Yes [provider]  Multiple Vitamin (MULTIVITAMIN WITH MINERALS) TABS tablet Take 1 tablet by mouth daily.   Yes [provider]  cephALEXin (KEFLEX) 500 MG capsule Take 1 capsule (500 mg total) by mouth 2 (two) times  daily. 07/22/18   [place] Giammona, Tinnie Gens, PA-C  HYDROcodone-acetaminophen (NORCO/VICODIN) 5-325 MG tablet Take 1 tablet by mouth every 6 (six) hours as needed for severe pain. Patient not taking: Reported on 07/22/2018 06/25/18   Cristina Gong, PA-C  Insulin Pen Needle 31G X 5 MM MISC 1 each by Does not apply route daily. Patient not taking: Reported on 07/22/2018 04/17/17   Hoy Register, MD  liraglutide (VICTOZA) 18 MG/3ML SOPN Inject 0.1 mLs (0.6 mg total) into the skin daily with breakfast. Patient not taking: Reported on 06/25/2018 04/24/17   Hoy Register, MD  ondansetron (ZOFRAN ODT) 4 MG disintegrating tablet Take 1 tablet (4 mg total) by mouth every 6 (six) hours as needed for nausea or vomiting. Patient not taking: Reported on 07/22/2018 06/25/18   Cristina Gong, PA-C  oxyCODONE-acetaminophen (PERCOCET/ROXICET) 5-325 MG tablet Take 1 tablet by mouth every 6 (six) hours as needed. 07/22/18   Jaquawn Saffran, Tinnie Gens, PA-C  tamsulosin (FLOMAX) 0.4 MG CAPS capsule Take 1 capsule (0.4 mg total) by mouth daily after supper. 07/22/18   Clay[place] Artist, PA-C    Family History Family History  Problem Relation Age of Onset  . Diabetes Mother   . Hypertension Mother     Social History Social History   Tobacco Use  . Smoking status: Never Smoker  . Smokeless tobacco: Never Used  Substance Use Topics  . Alcohol use: Yes  . Drug use: No  Allergies   Nsaids   Review of Systems Review of Systems  All other systems reviewed and are negative.    Physical Exam Updated Vital Signs BP 120/74 (BP Location: Right Arm)   Pulse 72   Temp 98.7 F (37.1 C) (Oral)   Resp 18   Ht 5\' 7"  (1.702 m)   Wt 122.9 kg   LMP 06/25/2018   SpO2 100%   BMI 42.44 kg/m   Physical Exam Vitals signs and nursing note reviewed.  Constitutional:      Appearance: She is well-developed.  HENT:     Head: Normocephalic and atraumatic.  Eyes:     General: No scleral icterus.       Right eye: No  discharge.        Left eye: No discharge.     Conjunctiva/sclera: Conjunctivae normal.     Pupils: Pupils are equal, round, and reactive to light.  Neck:     Musculoskeletal: Normal range of motion.     Vascular: No JVD.     Trachea: No tracheal deviation.  Pulmonary:     Effort: Pulmonary effort is normal.     Breath sounds: No stridor.  Abdominal:     Comments: Obese abdomen tenderness palpation of mid right abdomen no rebound guarding or masses no right lower quadrant or lower pelvic tenderness  Neurological:     Mental Status: She is alert and oriented to person, place, and time.     Coordination: Coordination normal.  Psychiatric:        Behavior: Behavior normal.        Thought Content: Thought content normal.        Judgment: Judgment normal.      ED Treatments / Results  Labs (all labs ordered are listed, but only abnormal results are displayed) Labs Reviewed  URINALYSIS, ROUTINE W REFLEX MICROSCOPIC - Abnormal; Notable for the following components:      Result Value   APPearance CLOUDY (*)    Hgb urine dipstick LARGE (*)    Protein, ur 30 (*)    Leukocytes,Ua MODERATE (*)    RBC / HPF >50 (*)    All other components within normal limits  CBC WITH DIFFERENTIAL/PLATELET - Abnormal; Notable for the following components:   Hemoglobin 9.5 (*)    HCT 33.2 (*)    MCV 74.8 (*)    MCH 21.4 (*)    MCHC 28.6 (*)    RDW 17.9 (*)    Platelets 578 (*)    All other components within normal limits  COMPREHENSIVE METABOLIC PANEL - Abnormal; Notable for the following components:   CO2 19 (*)    All other components within normal limits  URINE CULTURE  LIPASE, BLOOD  POC URINE PREG, ED    EKG None  Radiology Ct Renal Stone Study  Result Date: 07/22/2018 CLINICAL DATA:  Right-sided flank region pain and urinary frequency EXAM: CT ABDOMEN AND PELVIS WITHOUT CONTRAST TECHNIQUE: Multidetector CT imaging of the abdomen and pelvis was performed following the standard  protocol without oral or IV contrast. COMPARISON:  June 25, 2018 FINDINGS: Lower chest: Lung bases are clear. Hepatobiliary: There is fatty infiltration near the fissure for the ligamentum teres. Beyond this mild fatty infiltration, no focal liver lesions are evident. Gallbladder wall is not appreciably thickened. There is no evident biliary duct dilatation. Pancreas: No pancreatic mass or inflammatory focus. Spleen: No splenic lesions are evident. Adrenals/Urinary Tract: Adrenals bilaterally appear unremarkable. Kidneys bilaterally show no evident mass  or hydronephrosis on either side. There is a calculus in the peripheral upper pole right kidney measuring 2 mm. No intrarenal calculi are noted on the left. There is a calculus at the right ureterovesical junction measuring 5 x 4 mm. No other ureteral calculi are evident. Urinary bladder is midline with wall thickness within normal limits. Stomach/Bowel: There is no appreciable bowel wall or mesenteric thickening. Patient is status post gastric bypass procedure without wall thickening or fluid in postoperative regions. There is no evident bowel obstruction. Terminal ileum appears unremarkable. There is no free air or portal venous air. Vascular/Lymphatic: There is no abdominal aortic aneurysm. No vascular lesions are evident on this noncontrast enhanced study. There is no evident adenopathy in the abdomen or pelvis. Reproductive: The uterus is anteverted.  No evident pelvic mass. Other: The appendix appears normal. There is no abscess or ascites in the abdomen or pelvis. There is mild scarring in the region of the umbilicus. Musculoskeletal: There are no blastic or lytic bone lesions. No intramuscular lesions are evident. IMPRESSION: 1. 5 x 4 mm calculus at the right ureterovesical junction without appreciable hydronephrosis. 2. 2 mm calculus in the peripheral upper pole right kidney, nonobstructing. 3. Status post gastric bypass procedure without complicating  feature. No bowel obstruction. No abscess in the abdomen or pelvis. Appendix appears normal. Electronically Signed   By: Bretta BangWilliam  Woodruff III M.D.   On: 07/22/2018 15:28    Procedures Procedures (including critical care time)  Medications Ordered in ED Medications  oxyCODONE-acetaminophen (PERCOCET/ROXICET) 5-325 MG per tablet 1 tablet (1 tablet Oral Given 07/22/18 1556)     Initial Impression / Assessment and Plan / ED Course  I have reviewed the triage vital signs and the nursing notes.  Pertinent labs & imaging results that were available during my care of the patient were reviewed by me and considered in my medical decision making (see chart for details).        Labs: Urinalysis, urine culture, urine prior, CBC BMP  Imaging: CT renal  Consults:  Therapeutics:  Discharge Meds: Keflex, Percocet  Assessment/Plan: 23 year old female presents today with flank pain.  Patient had CT scan here showing distal ureteral stone.  She does have known stone disease, uncertain if this is persistent stone versus new one.  Although patient has moderate leukocytes and 11-20 WBCs she has no bacteria, she does have urinary symptoms so she will be started on antibiotics.  I have discussed that patient will need close follow-up with urology given persistent stone versus recurrent stone disease.  She return immediately she develops any new or worsening signs or symptoms.  She was also noted to be anemic here today, this is improved from her last visit.  She has no symptoms related to this.  Patient verbalized understanding and agreement to today's plan and no further questions or concerns.     Final Clinical Impressions(s) / ED Diagnoses   Final diagnoses:  Nephrolithiasis    ED Discharge Orders         Ordered    cephALEXin (KEFLEX) 500 MG capsule  2 times daily     07/22/18 1601    oxyCODONE-acetaminophen (PERCOCET/ROXICET) 5-325 MG tablet  Every 6 hours PRN     07/22/18 1601     tamsulosin (FLOMAX) 0.4 MG CAPS capsule  Daily after supper     07/22/18 1655           Rosalio LoudHedges, Quinley Nesler, PA-C 07/23/18 1240    Benjiman CorePickering, Nathan, MD 07/28/18 1505

## 2018-07-23 LAB — URINE CULTURE

## 2019-06-03 ENCOUNTER — Ambulatory Visit: Payer: Medicaid Other | Attending: Internal Medicine

## 2019-06-03 DIAGNOSIS — Z23 Encounter for immunization: Secondary | ICD-10-CM

## 2019-06-03 NOTE — Progress Notes (Signed)
   Covid-19 Vaccination Clinic  Name:  Amy Shea    MRN: [id number] DOB: 1996-02-03  06/03/2019  [name] was observed post Covid-19 immunization for 15 minutes without incident. She was provided with Vaccine Information Sheet and instruction to access the V-Safe system.   [name] was instructed to call 911 with any severe reactions post vaccine: Marland Kitchen Difficulty breathing  . Swelling of face and throat  . A fast heartbeat  . A bad rash all over body  . Dizziness and weakness   Immunizations Administered    Name Date Dose VIS Date Route   Pfizer COVID-19 Vaccine 06/03/2019 12:14 PM 0.3 mL 02/12/2019 Intramuscular   Manufacturer: [employer], [employer]   Lot: [id number]   NDC: [address]-8

## 2019-06-29 ENCOUNTER — Ambulatory Visit: Payer: Medicaid Other | Attending: Internal Medicine

## 2019-06-29 DIAGNOSIS — Z23 Encounter for immunization: Secondary | ICD-10-CM

## 2019-06-29 NOTE — Progress Notes (Signed)
   Covid-19 Vaccination Clinic  Name:  Amy Shea    MRN: [id number] DOB: 25-Jun-1995  06/29/2019  [name] was observed post Covid-19 immunization for 15 minutes without incident. She was provided with Vaccine Information Sheet and instruction to access the V-Safe system.   [name] was instructed to call 911 with any severe reactions post vaccine: Marland Kitchen Difficulty breathing  . Swelling of face and throat  . A fast heartbeat  . A bad rash all over body  . Dizziness and weakness   Immunizations Administered    Name Date Dose VIS Date Route   Pfizer COVID-19 Vaccine 06/29/2019 12:29 PM 0.3 mL 04/28/2018 Intramuscular   Manufacturer: [employer], [employer]   Lot: [id number]   NDC: [address]-4

## 2020-01-10 IMAGING — CT CT RENAL STONE PROTOCOL
2 of 4 series · 16 of 46 positions shown, 18 images · non-contrast
Comparison: June 25, 2018

CLINICAL DATA: Right-sided flank region pain and urinary frequency

EXAM:
CT ABDOMEN AND PELVIS WITHOUT CONTRAST
TECHNIQUE: Multidetector CT imaging of the abdomen and pelvis was performed
following the standard protocol without oral or IV contrast.

[Series 3: ap without · axial · non-contrast · 0.90mm/px · z∈[+778,+1208]mm · 13 of 98 slices shown, 15 images]
[im 6/98  soft-tissue]
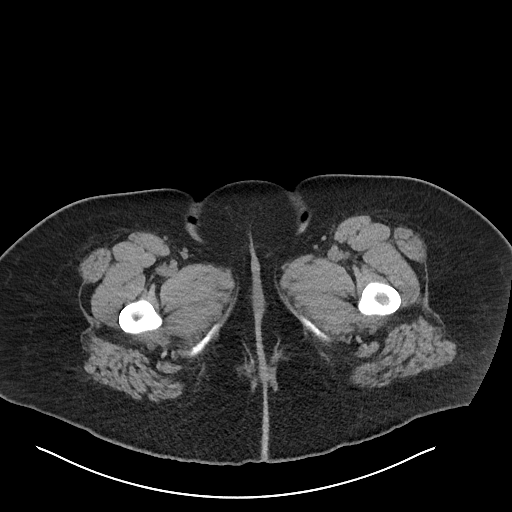
[im 6/98  bone]
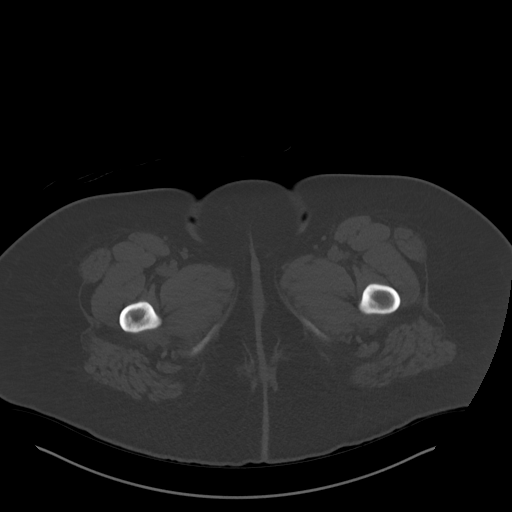
[im 11/98  soft-tissue]
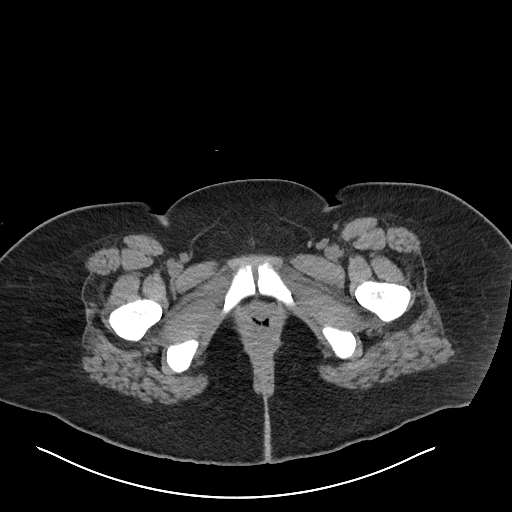
[im 22/98  soft-tissue]
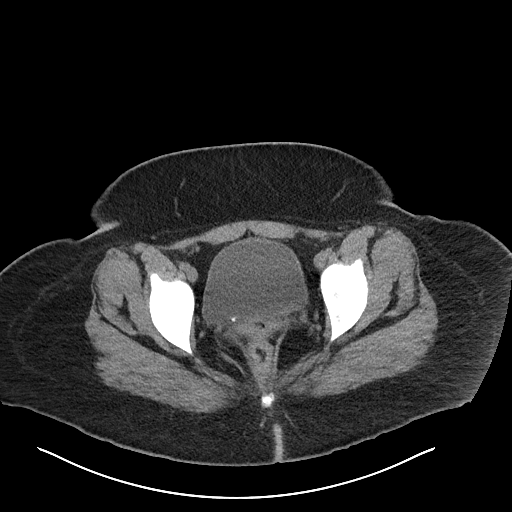
[im 27/98  soft-tissue]
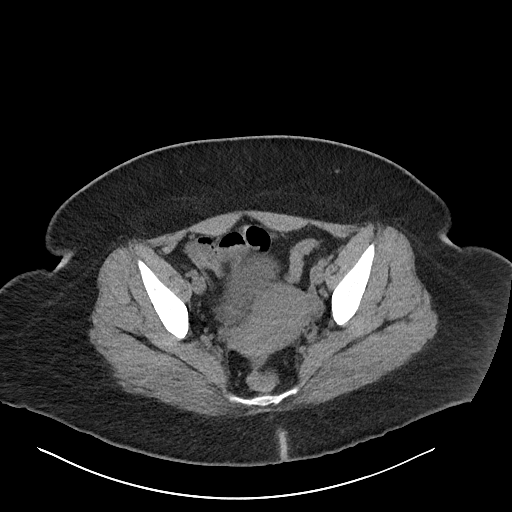
[im 33/98  soft-tissue]
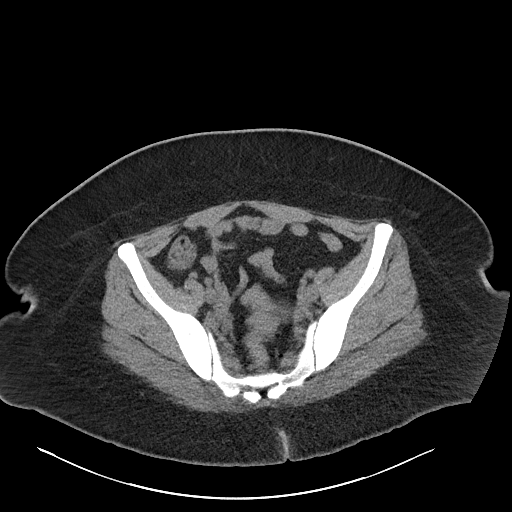
[im 44/98  soft-tissue]
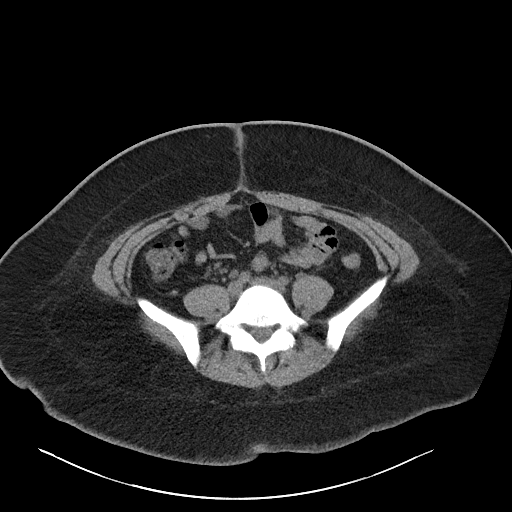
[im 49/98  soft-tissue]
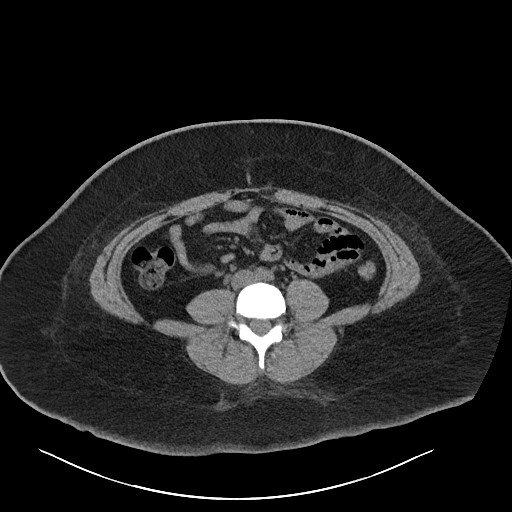
[im 54/98  soft-tissue]
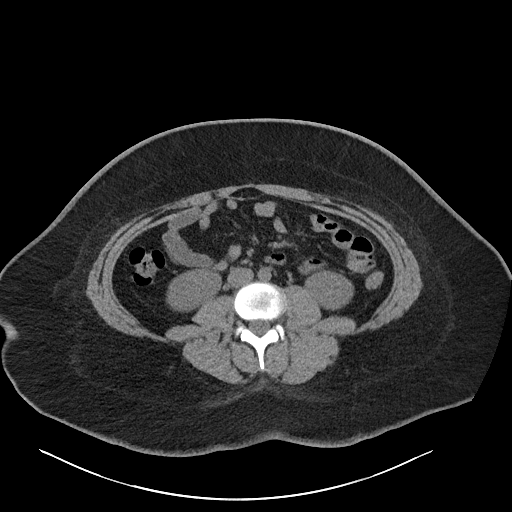
[im 65/98  soft-tissue]
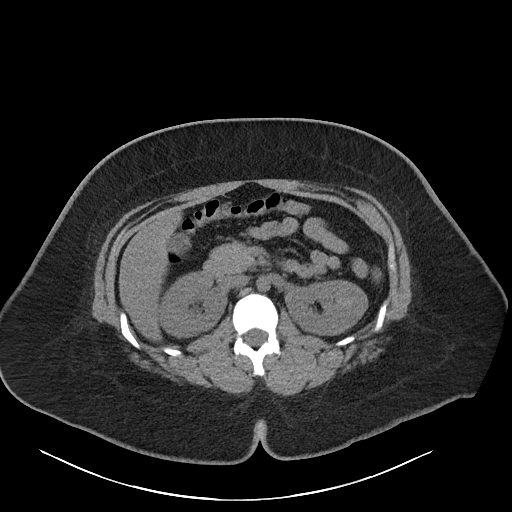
[im 65/98  bone]
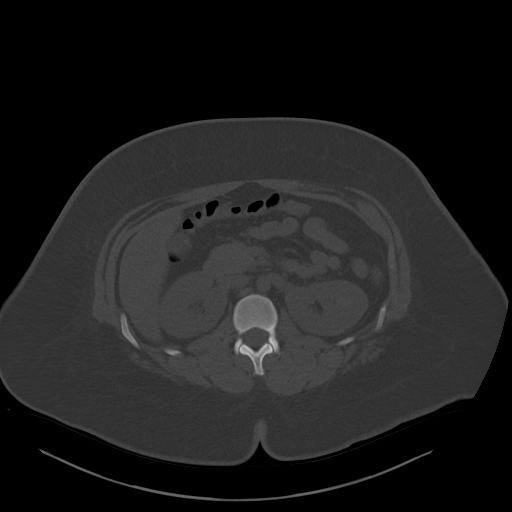
[im 71/98  soft-tissue]
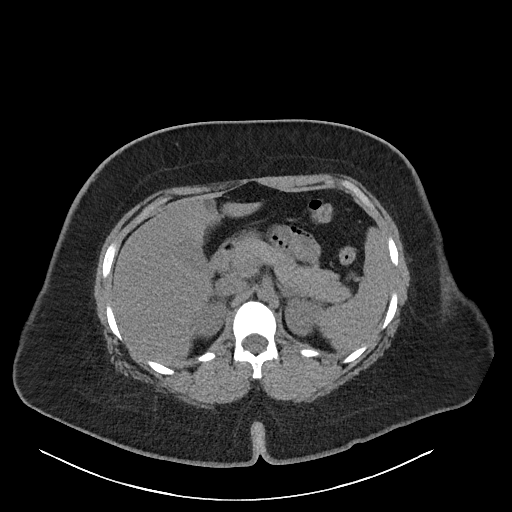
[im 76/98  soft-tissue]
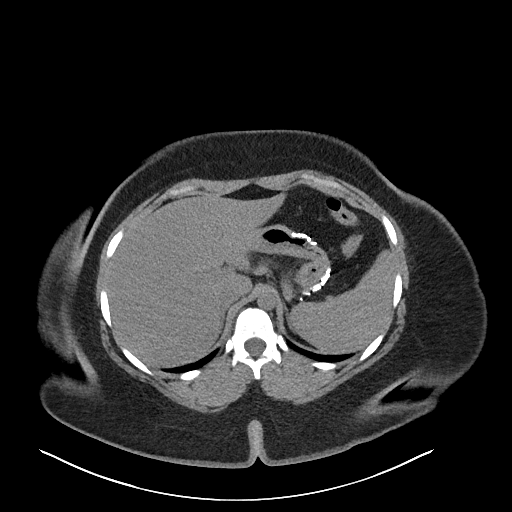
[im 87/98  soft-tissue]
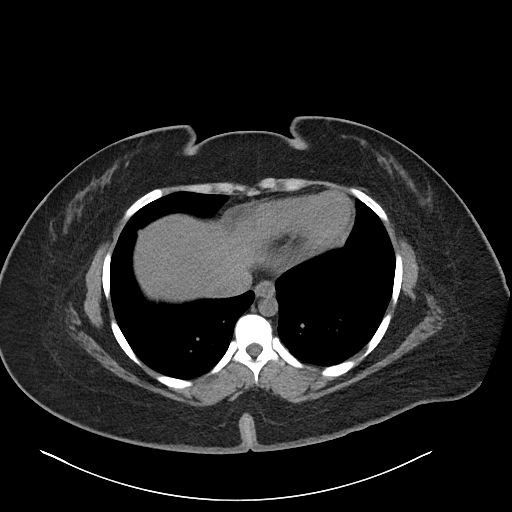
[im 92/98  soft-tissue]
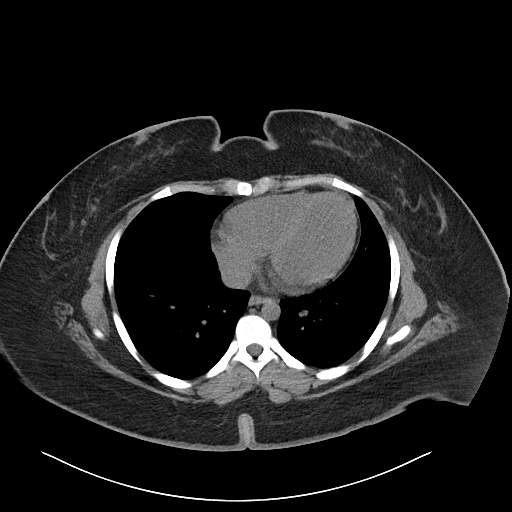

[Series 6: cor · coronal · 0.95mm/px · 3 of 112 slices shown]
[im 38/112  soft-tissue]
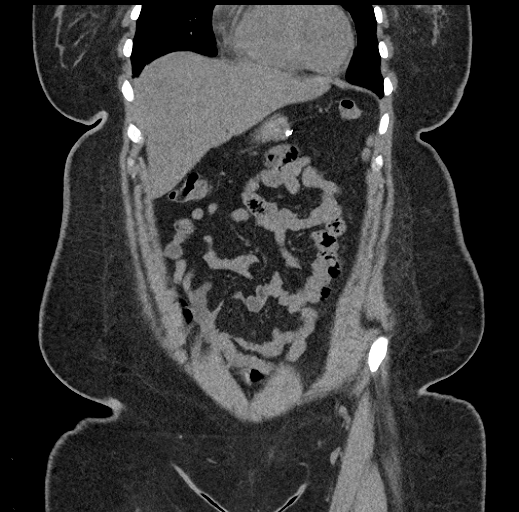
[im 50/112  soft-tissue]
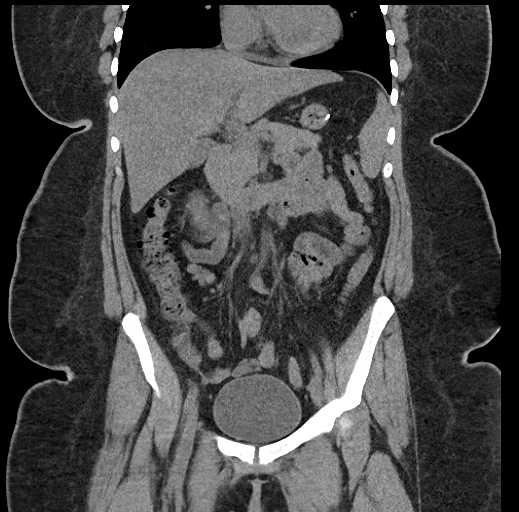
[im 62/112  soft-tissue]
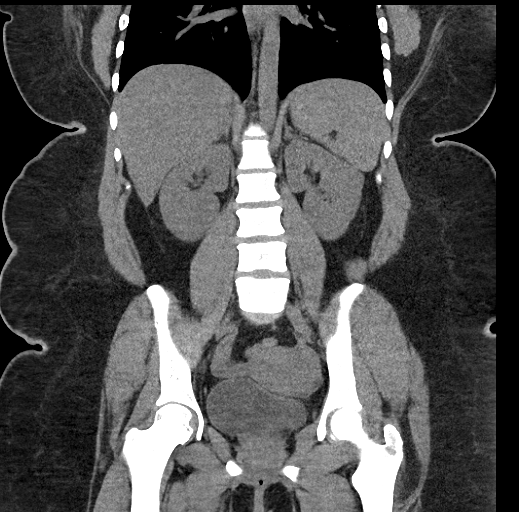

[16 of 46 positions shown; findings below may reference images not displayed]

FINDINGS: Lower chest: Lung bases are clear.

Hepatobiliary: There is fatty infiltration near the fissure for the
ligamentum teres. Beyond this mild fatty infiltration, no focal
liver lesions are evident. Gallbladder wall is not appreciably
thickened. There is no evident biliary duct dilatation.

Pancreas: No pancreatic mass or inflammatory focus.

Spleen: No splenic lesions are evident.

Adrenals/Urinary Tract: Adrenals bilaterally appear unremarkable.
Kidneys bilaterally show no evident mass or hydronephrosis on either
side. There is a calculus in the peripheral upper pole right kidney
measuring 2 mm. No intrarenal calculi are noted on the left. There
is a calculus at the right ureterovesical junction measuring 5 x 4
mm. No other ureteral calculi are evident. Urinary bladder is
midline with wall thickness within normal limits.

Stomach/Bowel: There is no appreciable bowel wall or mesenteric
thickening. Patient is status post gastric bypass procedure without
wall thickening or fluid in postoperative regions. There is no
evident bowel obstruction. Terminal ileum appears unremarkable.
There is no free air or portal venous air.

Vascular/Lymphatic: There is no abdominal aortic aneurysm. No
vascular lesions are evident on this noncontrast enhanced study.
There is no evident adenopathy in the abdomen or pelvis.

Reproductive: The uterus is anteverted.  No evident pelvic mass.

Other: The appendix appears normal. There is no abscess or ascites
in the abdomen or pelvis. There is mild scarring in the region of
the umbilicus.

Musculoskeletal: There are no blastic or lytic bone lesions. No
intramuscular lesions are evident.
IMPRESSION: 1. 5 x 4 mm calculus at the right ureterovesical junction without
appreciable hydronephrosis.

2. 2 mm calculus in the peripheral upper pole right kidney,
nonobstructing.

3. Status post gastric bypass procedure without complicating
feature. No bowel obstruction. No abscess in the abdomen or pelvis.
Appendix appears normal.

## 2023-06-28 ENCOUNTER — Other Ambulatory Visit: Payer: Self-pay

## 2023-06-28 ENCOUNTER — Emergency Department (HOSPITAL_COMMUNITY)
Admission: EM | Admit: 2023-06-28 | Discharge: 2023-06-29 | Disposition: A | Discharge status: Home / Self Care | Attending: Emergency Medicine | Admitting: Emergency Medicine

## 2023-06-28 ENCOUNTER — Encounter (HOSPITAL_COMMUNITY): Payer: Self-pay | Admitting: *Deleted

## 2023-06-28 ENCOUNTER — Emergency Department (HOSPITAL_COMMUNITY)

## 2023-06-28 DIAGNOSIS — R253 Fasciculation: Secondary | ICD-10-CM | POA: Insufficient documentation

## 2023-06-28 DIAGNOSIS — Z794 Long term (current) use of insulin: Secondary | ICD-10-CM | POA: Insufficient documentation

## 2023-06-28 DIAGNOSIS — G514 Facial myokymia: Secondary | ICD-10-CM

## 2023-06-28 LAB — COMPREHENSIVE METABOLIC PANEL WITH GFR
ALT: 22 U/L (ref 0–44)
AST: 18 U/L (ref 15–41)
Albumin: 3.9 g/dL (ref 3.5–5.0)
Alkaline Phosphatase: 64 U/L (ref 38–126)
Anion gap: 13 (ref 5–15)
BUN: 5 mg/dL — ABNORMAL LOW (ref 6–20)
CO2: 20 mmol/L — ABNORMAL LOW (ref 22–32)
Calcium: 9.2 mg/dL (ref 8.9–10.3)
Chloride: 105 mmol/L (ref 98–111)
Creatinine, Ser: 0.73 mg/dL (ref 0.44–1.00)
GFR, Estimated: >60 mL/min (ref >60)
Glucose, Bld: 92 mg/dL (ref 70–99)
Potassium: 3.8 mmol/L (ref 3.5–5.1)
Sodium: 138 mmol/L (ref 135–145)
Total Bilirubin: 0.5 mg/dL (ref 0.0–1.2)
Total Protein: 7.5 g/dL (ref 6.5–8.1)

## 2023-06-28 LAB — URINALYSIS, ROUTINE W REFLEX MICROSCOPIC
Bilirubin Urine: NEGATIVE
Glucose, UA: NEGATIVE mg/dL
Ketones, ur: 5 mg/dL — AB
Leukocytes,Ua: NEGATIVE
Nitrite: NEGATIVE
Protein, ur: 100 mg/dL — AB
RBC / HPF: >50 RBC/hpf (ref 0–5)
Specific Gravity, Urine: 1.027 (ref 1.005–1.030)
pH: 5 (ref 5.0–8.0)

## 2023-06-28 LAB — CBC
HCT: 36.8 % (ref 36.0–46.0)
Hemoglobin: 11.3 g/dL — ABNORMAL LOW (ref 12.0–15.0)
MCH: 25 pg — ABNORMAL LOW (ref 26.0–34.0)
MCHC: 30.7 g/dL (ref 30.0–36.0)
MCV: 81.4 fL (ref 80.0–100.0)
Platelets: 421 10*3/uL — ABNORMAL HIGH (ref 150–400)
RBC: 4.52 MIL/uL (ref 3.87–5.11)
RDW: 20 % — ABNORMAL HIGH (ref 11.5–15.5)
WBC: 7.9 10*3/uL (ref 4.0–10.5)
nRBC: 0 % (ref 0.0–0.2)

## 2023-06-28 LAB — MAGNESIUM: Magnesium: 1.9 mg/dL (ref 1.7–2.4)

## 2023-06-28 LAB — ETHANOL: Alcohol, Ethyl (B): <15 mg/dL (ref <15)

## 2023-06-28 LAB — HCG, QUANTITATIVE, PREGNANCY: hCG, Beta Chain, Quant, S: <1 m[IU]/mL (ref <5)

## 2023-06-28 LAB — HCG, SERUM, QUALITATIVE: Preg, Serum: NEGATIVE

## 2023-06-28 MED ORDER — LORAZEPAM 2 MG/ML IJ SOLN
1.0000 mg | Freq: Once | INTRAMUSCULAR | Status: AC
  Administered 2023-06-28: 1 mg via INTRAVENOUS
  Filled 2023-06-28: qty 1

## 2023-06-28 MED ORDER — LORAZEPAM 1 MG PO TABS: 1.0000 mg | ORAL_TABLET | Freq: Once | ORAL | Status: DC

## 2023-06-28 MED ORDER — ADENOSINE 6 MG/2ML IV SOLN
INTRAVENOUS | Status: AC
  Filled 2023-06-28: qty 2

## 2023-06-28 MED ORDER — DIPHENHYDRAMINE HCL 50 MG/ML IJ SOLN
50.0000 mg | Freq: Once | INTRAMUSCULAR | Status: AC
  Administered 2023-06-28: 50 mg via INTRAVENOUS
  Filled 2023-06-28: qty 1

## 2023-06-28 MED ORDER — LACTATED RINGERS IV BOLUS
1000.0000 mL | Freq: Once | INTRAVENOUS | Status: AC
  Administered 2023-06-28: 1000 mL via INTRAVENOUS

## 2023-06-28 NOTE — ED Triage Notes (Signed)
 The pt has had these episode in the past with no diagnosis  the last one was in October

## 2023-06-28 NOTE — ED Triage Notes (Signed)
 The pt was sitting on her steps when she felt cold and had a twitching sensation in her lt arm  she has panic attacks and anxiety but she reports that this does not feel like that   lmp today

## 2023-06-28 NOTE — ED Provider Notes (Signed)
 [place] EMERGENCY DEPARTMENT AT [facility] Provider Note   CSN: [id number] Arrival date & time: 06/28/23  1919     History  Chief Complaint  Patient presents with   body twitching    Amy Shea  is a 28 y.o. female.  This is a 28 year old female presents emergency room today due to twitching in her face.  Patient reports that she has had these episodes occasionally, last 1 was in October.  She has never been given a cause for this.  Patient says that she was with her brother, began to feel cold, and then developed twitching of her face.  Patient denies taking any drugs, or any medications today.  She does endorse drinking small amount of alcohol today.  She is here with her brother helps provide additional history.        Home Medications Prior to Admission medications   Medication Sig Start Date End Date Taking? Authorizing Provider  acetaminophen  (TYLENOL ) 500 MG tablet Take 1,000 mg by mouth every 6 (six) hours as needed for mild pain.    [provider]  cephALEXin  (KEFLEX ) 500 MG capsule Take 1 capsule (500 mg total) by mouth 2 (two) times daily. 07/22/18   Hedges, Susana Enter, PA-C  HYDROcodone -acetaminophen  (NORCO/VICODIN) 5-325 MG tablet Take 1 tablet by mouth every 6 (six) hours as needed for severe pain. Patient not taking: Reported on 07/22/2018 06/25/18   Dwain Giovanni, PA-C  Insulin  Pen Needle 31G X 5 MM MISC 1 each by Does not apply route daily. Patient not taking: Reported on 07/22/2018 04/17/17   Newlin, Enobong, MD  liraglutide  (VICTOZA ) 18 MG/3ML SOPN Inject 0.1 mLs (0.6 mg total) into the skin daily with breakfast. Patient not taking: Reported on 06/25/2018 04/24/17   Newlin, Enobong, MD  Multiple Vitamin (MULTIVITAMIN WITH MINERALS) TABS tablet Take 1 tablet by mouth daily.    [provider]  ondansetron  (ZOFRAN  ODT) 4 MG disintegrating tablet Take 1 tablet (4 mg total) by mouth every 6 (six) hours as needed for nausea or  vomiting. Patient not taking: Reported on 07/22/2018 06/25/18   Dwain Giovanni, PA-C  oxyCODONE -acetaminophen  (PERCOCET/ROXICET) 5-325 MG tablet Take 1 tablet by mouth every 6 (six) hours as needed. 07/22/18   Hedges, Susana Enter, PA-C  tamsulosin  (FLOMAX ) 0.4 MG CAPS capsule Take 1 capsule (0.4 mg total) by mouth daily after supper. 07/22/18   Kendrick, Caitlyn S, PA-C      Allergies    Nsaids    Review of Systems   Review of Systems  Physical Exam Updated Vital Signs BP 129/78   Pulse (!) 106   Temp 98.6 F (37 C)   Resp 18   Ht 5\' 7"  (1.702 m)   Wt 122.9 kg   LMP 06/28/2023   SpO2 100%   BMI 42.44 kg/m  Physical Exam Vitals reviewed.  HENT:     Head: Normocephalic and atraumatic.  Cardiovascular:     Rate and Rhythm: Normal rate.  Pulmonary:     Effort: Pulmonary effort is normal.     Breath sounds: Normal breath sounds.  Neurological:     Mental Status: She is alert.     Comments: 5 5 strength in bilateral upper extremities.  Patient with 5 out of 5 straight leg raise strength.  Normal sensation of bilateral upper and lower extremities.  Patient's cranial nerves II through XII intact.  Patient has twitching of her eyelids, eyes and right side of her upper lip.  Psychiatric:  Mood and Affect: Mood normal.     ED Results / Procedures / Treatments   Labs (all labs ordered are listed, but only abnormal results are displayed) Labs Reviewed  COMPREHENSIVE METABOLIC PANEL WITH GFR - Abnormal; Notable for the following components:      Result Value   CO2 20 (*)    BUN 5 (*)    All other components within normal limits  CBC - Abnormal; Notable for the following components:   Hemoglobin 11.3 (*)    MCH 25.0 (*)    RDW 20.0 (*)    Platelets 421 (*)    All other components within normal limits  URINALYSIS, ROUTINE W REFLEX MICROSCOPIC - Abnormal; Notable for the following components:   APPearance HAZY (*)    Hgb urine dipstick LARGE (*)    Ketones, ur 5 (*)     Protein, ur 100 (*)    Bacteria, UA RARE (*)    All other components within normal limits  HCG, SERUM, QUALITATIVE  MAGNESIUM  ETHANOL  HCG, QUANTITATIVE, PREGNANCY    EKG None  Radiology No results found.  Procedures Procedures    Medications Ordered in ED Medications  diphenhydrAMINE (BENADRYL) injection 50 mg (50 mg Intravenous Given 06/28/23 2244)  LORazepam (ATIVAN) injection 1 mg (1 mg Intravenous Given 06/28/23 2245)  lactated ringers bolus 1,000 mL (1,000 mLs Intravenous New Bag/Given 06/28/23 2244)    ED Course/ Medical Decision Making/ A&P                                 Medical Decision Making 28 year old female here today for twitching.  Differential diagnoses include anxiety, pseudoseizure, true seizure, less likely CVA, dystonic reaction.  Plan-will begin treating the patient for possibility of anxiety, pseudoseizure, partial seizure and dystonic reaction with Benadryl and Ativan.  Will obtain imaging the patient's head.  I have no focal or lateralizing deficits.  Do not believe this is a CVA.  Will check labs on the patient.  Pt signed out to Dr. Maralee Senate.  Amount and/or Complexity of Data Reviewed Labs: ordered. Radiology: ordered.  Risk Prescription drug management.           Final Clinical Impression(s) / ED Diagnoses Final diagnoses:  Facial twitching    Rx / DC Orders ED Discharge Orders     None         Nathanael Baker, DO 06/28/23 2329

## 2023-06-29 NOTE — ED Provider Notes (Signed)
 Patient is upset that she does not have an answer and is not being referred to a neurologist in [place]  where is reportedly lives.  EDP politely stated that we are apologetic that we cannot give her an answer for her episodic twitching but have referred to a local specialist for this who we hope can provide her those answers.  Patient and family member as now wanting a neurologist in [place] .  EDP politely stated we must refer within our network based on federal policy and moreover I am unfamiliar with neurologists or physicians in other states.  Patient then wanted medication for this.  EDP stated that at this time without a diagnosis there is no medication.  Patient seems annoyed by all the information I have provided.  She is stable for discharge.     Conrad Zajkowski, MD 06/29/23 2440

## 2023-06-29 NOTE — ED Provider Notes (Signed)
 12:27 AM case d/w Dr. Murvin Arthurs, follow up outpatient with [facility] neurology Dr. Winferd Hatter.  This was placed on discharge paperwork    Prakash Kimberling, MD 06/29/23 0028
